# Patient Record
Sex: Male | Born: 1970 | Race: White | Hispanic: No | Marital: Married
Health system: Southern US, Community
[De-identification: ages and names within clinical notes are randomized; demographics above are authoritative.]

## PROBLEM LIST (undated history)

## (undated) DIAGNOSIS — G4733 Obstructive sleep apnea (adult) (pediatric): Secondary | ICD-10-CM

## (undated) DIAGNOSIS — F32A Depression, unspecified: Secondary | ICD-10-CM

## (undated) DIAGNOSIS — I10 Essential (primary) hypertension: Secondary | ICD-10-CM

## (undated) DIAGNOSIS — E785 Hyperlipidemia, unspecified: Secondary | ICD-10-CM

---

## 2020-06-23 ENCOUNTER — Encounter (HOSPITAL_COMMUNITY): Payer: Self-pay | Admitting: Emergency Medicine

## 2020-06-23 ENCOUNTER — Other Ambulatory Visit: Payer: Self-pay

## 2020-06-23 ENCOUNTER — Emergency Department (HOSPITAL_COMMUNITY)
Admission: EM | Admit: 2020-06-23 | Discharge: 2020-06-23 | Disposition: A | Discharge status: Home / Self Care | Payer: BC Managed Care – PPO | Attending: Emergency Medicine | Admitting: Emergency Medicine

## 2020-06-23 ENCOUNTER — Emergency Department (HOSPITAL_COMMUNITY): Payer: BC Managed Care – PPO

## 2020-06-23 DIAGNOSIS — R1013 Epigastric pain: Secondary | ICD-10-CM | POA: Insufficient documentation

## 2020-06-23 DIAGNOSIS — R42 Dizziness and giddiness: Secondary | ICD-10-CM | POA: Insufficient documentation

## 2020-06-23 DIAGNOSIS — I10 Essential (primary) hypertension: Secondary | ICD-10-CM | POA: Diagnosis not present

## 2020-06-23 DIAGNOSIS — R110 Nausea: Secondary | ICD-10-CM | POA: Insufficient documentation

## 2020-06-23 DIAGNOSIS — R1010 Upper abdominal pain, unspecified: Secondary | ICD-10-CM | POA: Diagnosis present

## 2020-06-23 HISTORY — DX: Obstructive sleep apnea (adult) (pediatric): G47.33

## 2020-06-23 HISTORY — DX: Depression, unspecified: F32.A

## 2020-06-23 HISTORY — DX: Hyperlipidemia, unspecified: E78.5

## 2020-06-23 HISTORY — DX: Essential (primary) hypertension: I10

## 2020-06-23 LAB — COMPREHENSIVE METABOLIC PANEL
ALT: 34 U/L (ref 0–44)
AST: 25 U/L (ref 15–41)
Albumin: 3.8 g/dL (ref 3.5–5.0)
Alkaline Phosphatase: 74 U/L (ref 38–126)
Anion gap: 13 (ref 5–15)
BUN: 11 mg/dL (ref 6–20)
CO2: 19 mmol/L — ABNORMAL LOW (ref 22–32)
Calcium: 9.2 mg/dL (ref 8.9–10.3)
Chloride: 106 mmol/L (ref 98–111)
Creatinine, Ser: 0.9 mg/dL (ref 0.61–1.24)
GFR, Estimated: >60 mL/min (ref >60)
Glucose, Bld: 119 mg/dL — ABNORMAL HIGH (ref 70–99)
Potassium: 3.7 mmol/L (ref 3.5–5.1)
Sodium: 138 mmol/L (ref 135–145)
Total Bilirubin: 1 mg/dL (ref 0.3–1.2)
Total Protein: 6.6 g/dL (ref 6.5–8.1)

## 2020-06-23 LAB — TROPONIN I (HIGH SENSITIVITY)
Troponin I (High Sensitivity): 6 ng/L (ref <18)
Troponin I (High Sensitivity): 5 ng/L (ref <18)

## 2020-06-23 LAB — CBC WITH DIFFERENTIAL/PLATELET
Abs Immature Granulocytes: 0.06 10*3/uL (ref 0.00–0.07)
Basophils Absolute: 0.1 10*3/uL (ref 0.0–0.1)
Basophils Relative: 1 %
Eosinophils Absolute: 0.2 10*3/uL (ref 0.0–0.5)
Eosinophils Relative: 1 %
HCT: 49.2 % (ref 39.0–52.0)
Hemoglobin: 16.2 g/dL (ref 13.0–17.0)
Immature Granulocytes: 1 %
Lymphocytes Relative: 15 %
Lymphs Abs: 1.9 10*3/uL (ref 0.7–4.0)
MCH: 27.6 pg (ref 26.0–34.0)
MCHC: 32.9 g/dL (ref 30.0–36.0)
MCV: 84 fL (ref 80.0–100.0)
Monocytes Absolute: 1 10*3/uL (ref 0.1–1.0)
Monocytes Relative: 8 %
Neutro Abs: 9.4 10*3/uL — ABNORMAL HIGH (ref 1.7–7.7)
Neutrophils Relative %: 74 %
Platelets: 353 10*3/uL (ref 150–400)
RBC: 5.86 MIL/uL — ABNORMAL HIGH (ref 4.22–5.81)
RDW: 14.6 % (ref 11.5–15.5)
WBC: 12.5 10*3/uL — ABNORMAL HIGH (ref 4.0–10.5)
nRBC: 0 % (ref 0.0–0.2)

## 2020-06-23 MED ORDER — ONDANSETRON 4 MG PO TBDP: 4.0000 mg | ORAL_TABLET | Freq: Three times a day (TID) | ORAL | 0 refills | Status: AC | PRN

## 2020-06-23 MED ORDER — METOCLOPRAMIDE HCL 5 MG/ML IJ SOLN
10.0000 mg | Freq: Once | INTRAMUSCULAR | Status: AC
  Administered 2020-06-23: 10 mg via INTRAVENOUS
  Filled 2020-06-23: qty 2

## 2020-06-23 MED ORDER — DIPHENHYDRAMINE HCL 50 MG/ML IJ SOLN
25.0000 mg | Freq: Once | INTRAMUSCULAR | Status: AC
  Administered 2020-06-23: 25 mg via INTRAVENOUS
  Filled 2020-06-23: qty 1

## 2020-06-23 NOTE — Discharge Instructions (Signed)
Please read and follow all provided instructions.  Your diagnoses today include:  1. Dizziness   2. Nausea   3. Epigastric pain     Tests performed today include:  An EKG of your heart  A chest x-ray  Cardiac enzymes - a blood test for heart muscle damage  Blood counts and electrolytes  Liver function test - was normal  Vital signs. See below for your results today.   Medications prescribed:   Take any prescribed medications only as directed.  Follow-up instructions: Please follow-up with your primary care provider as soon as you can for further evaluation of your symptoms.   Return instructions:  SEEK IMMEDIATE MEDICAL ATTENTION IF:  You have severe chest pain, especially if the pain is crushing or pressure-like and spreads to the arms, back, neck, or jaw, or if you have sweating, nausea (feeling sick to your stomach), or shortness of breath. THIS IS AN EMERGENCY. Don't wait to see if the pain will go away. Get medical help at once. Call 911 or [phone] (operator). DO NOT drive yourself to the hospital.   Your chest pain gets worse and does not go away with rest.   You have an attack of chest pain lasting longer than usual, despite rest and treatment with the medications your caregiver has prescribed.   You wake from sleep with chest pain or shortness of breath.  You feel dizzy or faint.  You have chest pain not typical of your usual pain for which you originally saw your caregiver.   You have any other emergent concerns regarding your health.  Additional Information: Chest pain comes from many different causes. Your caregiver has diagnosed you as having chest pain that is not specific for one problem, but does not require admission.  You are at low risk for an acute heart condition or other serious illness.   Your vital signs today were: BP (!) 142/84   Pulse 77   Temp 98.4 F (36.9 C) (Oral)   Resp 17   Ht 5\' 8"  (1.727 m)   Wt (!) 163.3 kg   SpO2 95%   BMI 54.74  kg/m  If your blood pressure (BP) was elevated above 135/85 this visit, please have this repeated by your doctor within one month. --------------

## 2020-06-23 NOTE — ED Triage Notes (Signed)
Pt arrives via EMS with intermittent abd pain and dizziness. Endorses increased nausea today and weakness in both legs.

## 2020-06-23 NOTE — ED Provider Notes (Signed)
[facility] EMERGENCY DEPARTMENT Provider Note   CSN: [id number] Arrival date & time: 06/23/20  1100     History Chief Complaint  Patient presents with  . Abdominal Pain    Jeffrey Whitehead is a 49 y.o. male.  Patient presents to the emergency department for evaluation of transient dizziness, weakness, nausea, and abdominal pain.  He is from [facility], [place] and has been visiting for a conference.  He states that he woke at 9:15 AM.  Immediately upon waking he had nausea and dizziness and a mild pain in his upper abdomen.  Dizziness was described as spinning and feeling lightheaded.  He denies chest pain, shortness of breath.  He thought that he would feel better if he got a shower.  He took a quick shower and symptoms persisted prompting call to EMS.  Patient was transported to the hospital.  Patient states that he had a similar episode x2 last Friday while he was driving with his son.  These were overall short-lived and resolved.  Patient does have a history of hypertension, hyperlipidemia, sleep apnea.  He states that he had a catheterization done about 10 years ago which showed normal coronary arteries per his report. Patient denies signs of stroke including: facial droop, slurred speech, aphasia, weakness/numbness in extremities, imbalance/trouble walking. Patient denies risk factors for pulmonary embolism including: unilateral leg swelling, history of DVT/PE/other blood clots, use of exogenous hormones, recent immobilizations, recent surgery, recent travel (>4hr segment), malignancy, hemoptysis.          Past Medical History:  Diagnosis Date  . Depression   . Hyperlipemia   . Hypertension   . OSA (obstructive sleep apnea)     There are no problems to display for this patient.   The histories are not reviewed yet. Please review them in the "History" navigator section and refresh this SmartLink.     No family history on file.  Social History    Tobacco Use  . Smoking status: Not on file  Substance Use Topics  . Alcohol use: Not on file  . Drug use: Not on file    Home Medications Prior to Admission medications   Not on File    Allergies    Penicillins  Review of Systems   Review of Systems  Constitutional: Negative for diaphoresis and fever.  Eyes: Negative for redness.  Respiratory: Negative for cough and shortness of breath.   Cardiovascular: Negative for chest pain, palpitations and leg swelling.  Gastrointestinal: Positive for abdominal pain and nausea. Negative for vomiting.  Genitourinary: Negative for dysuria.  Musculoskeletal: Negative for back pain and neck pain.  Skin: Negative for rash.  Neurological: Positive for dizziness, weakness (Generalized) and headaches. Negative for seizures, syncope, facial asymmetry, speech difficulty, light-headedness and numbness.  Psychiatric/Behavioral: The patient is not nervous/anxious.     Physical Exam Updated Vital Signs BP 131/76 (BP Location: Right Arm)   Pulse 81   Temp 98.4 F (36.9 C) (Oral)   Resp 18   Ht 5\' 8"  (1.727 m)   Wt (!) 163.3 kg   SpO2 97%   BMI 54.74 kg/m   Physical Exam Vitals and nursing note reviewed.  Constitutional:      Appearance: He is well-developed. He is not diaphoretic.  HENT:     Head: Normocephalic and atraumatic.     Mouth/Throat:     Mouth: Mucous membranes are not dry.  Eyes:     Conjunctiva/sclera: Conjunctivae normal.  Neck:  Vascular: Normal carotid pulses. No carotid bruit or JVD.     Trachea: Trachea normal. No tracheal deviation.  Cardiovascular:     Rate and Rhythm: Normal rate and regular rhythm.     Pulses: No decreased pulses.     Heart sounds: Normal heart sounds, S1 normal and S2 normal. Heart sounds not distant. No murmur heard.   Pulmonary:     Effort: Pulmonary effort is normal. No respiratory distress.     Breath sounds: Normal breath sounds. No wheezing.  Chest:     Chest wall: No  tenderness.  Abdominal:     General: Bowel sounds are normal.     Palpations: Abdomen is soft.     Tenderness: There is no abdominal tenderness. There is no guarding or rebound.  Genitourinary:    Testes:        Right: Tenderness or swelling not present.        Left: Tenderness or swelling not present.  Musculoskeletal:     Cervical back: Normal range of motion and neck supple. No muscular tenderness.  Skin:    General: Skin is warm and dry.     Coloration: Skin is not pale.  Neurological:     Mental Status: He is alert.     ED Results / Procedures / Treatments   Labs (all labs ordered are listed, but only abnormal results are displayed) Labs Reviewed  CBC WITH DIFFERENTIAL/PLATELET - Abnormal; Notable for the following components:      Result Value   WBC 12.5 (*)    RBC 5.86 (*)    Neutro Abs 9.4 (*)    All other components within normal limits  COMPREHENSIVE METABOLIC PANEL - Abnormal; Notable for the following components:   CO2 19 (*)    Glucose, Bld 119 (*)    All other components within normal limits  TROPONIN I (HIGH SENSITIVITY)  TROPONIN I (HIGH SENSITIVITY)    ED ECG REPORT   Date: 06/23/2020  Rate: 78  Rhythm: normal sinus rhythm  QRS Axis: normal  Intervals: normal  ST/T Wave abnormalities: nonspecific T wave changes  Conduction Disutrbances:none  Narrative Interpretation:   Old EKG Reviewed: none available  I have personally reviewed the EKG tracing and agree with the computerized printout as noted.  Radiology DG Chest 2 View  Result Date: 06/23/2020 CLINICAL DATA:  Dizziness, nausea, abdominal pain EXAM: CHEST - 2 VIEW COMPARISON:  None. FINDINGS: The heart size and mediastinal contours are within normal limits. Both lungs are clear. The visualized skeletal structures are unremarkable. IMPRESSION: No active cardiopulmonary disease. Electronically Signed   By: Duanne Guess D.O.   On: 06/23/2020 11:58    Procedures Procedures (including  critical care time)  Medications Ordered in ED Medications  metoCLOPramide (REGLAN) injection 10 mg (10 mg Intravenous Given 06/23/20 1424)  diphenhydrAMINE (BENADRYL) injection 25 mg (25 mg Intravenous Given 06/23/20 1424)    ED Course  I have reviewed the triage vital signs and the nursing notes.  Pertinent labs & imaging results that were available during my care of the patient were reviewed by me and considered in my medical decision making (see chart for details).  Patient seen and examined. Work-up initiated.  Will ensure no signs of cardiac ischemia today.  EKG reviewed.  Will check chest x-ray and cardiac labs.  Patient currently asymptomatic and appears well.  Vital signs are reassuring.  Vital signs reviewed and are as follows: BP 131/76 (BP Location: Right Arm)   Pulse 81  Temp 98.4 F (36.9 C) (Oral)   Resp 18   Ht 5\' 8"  (1.727 m)   Wt (!) 163.3 kg   SpO2 97%   BMI 54.74 kg/m   1:29 PM patient rechecked, updated on results to this point.  We discussed nonspecificity of white blood cell count.  Patient currently complaining of ongoing nausea as well as development of a headache is arrival.  Patient is not driving home tonight as his wife is traveling up to pick him up.  I have ordered Reglan and Benadryl to treat headache and nausea.  Will reassess.  Currently awaiting second troponin.  4:13 PM 2nd troponin negative.  Patient updated on results.  Patient states that headache is still present but much improved from arrival, as well as nausea.  Wife now bedside.  We discussed laboratory findings.  Encourage PCP follow-up when he returns home.  Will give prescription of Zofran to use if nausea returns.  Patient was counseled to return with severe chest pain, especially if the pain is crushing or pressure-like and spreads to the arms, back, neck, or jaw, or if they have sweating, nausea, or shortness of breath with the pain. They were encouraged to call 911 with these symptoms.    Patient counseled to return if they have weakness in their arms or legs, slurred speech, trouble walking or talking, confusion, trouble with their balance, or if they have any other concerns. Patient verbalizes understanding and agrees with plan.      MDM Rules/Calculators/A&P                          Patient with dizziness and nausea today with associated vertigo.  Symptoms now improved.  Cardiac work-up is negative with nonischemic EKG, troponin negative x2, negative chest x-ray.  Patient did not have significant chest pain and I have low concern for PE.  No hypoxia or tachycardia.  Also low concern for dissection.  CMP is negative, abdomen is soft and nontender.  Patient had a headache that began after arrival.  No focal neurologic deficits.  Improved with Reglan and Benadryl.  Doubt hemorrhagic or ischemic stroke.  Strict return instructions discussed as above.    Final Clinical Impression(s) / ED Diagnoses Final diagnoses:  Dizziness  Nausea  Epigastric pain    Rx / DC Orders ED Discharge Orders         Ordered    ondansetron (ZOFRAN ODT) 4 MG disintegrating tablet  Every 8 hours PRN        06/23/20 1604           13/10/21, PA-C 06/23/20 1617    13/10/21, MD 06/23/20 1644

## 2020-06-23 NOTE — ED Notes (Signed)
Pt discharged via wheelchair with family. All questions and concerns addressed. No complaints at this time.   

## 2021-05-25 IMAGING — DX DG CHEST 2V
2 series · 2 of 2 positions shown · non-contrast
Comparison: None.

CLINICAL DATA: Dizziness, nausea, abdominal pain

EXAM:
CHEST - 2 VIEW

[chest pa]
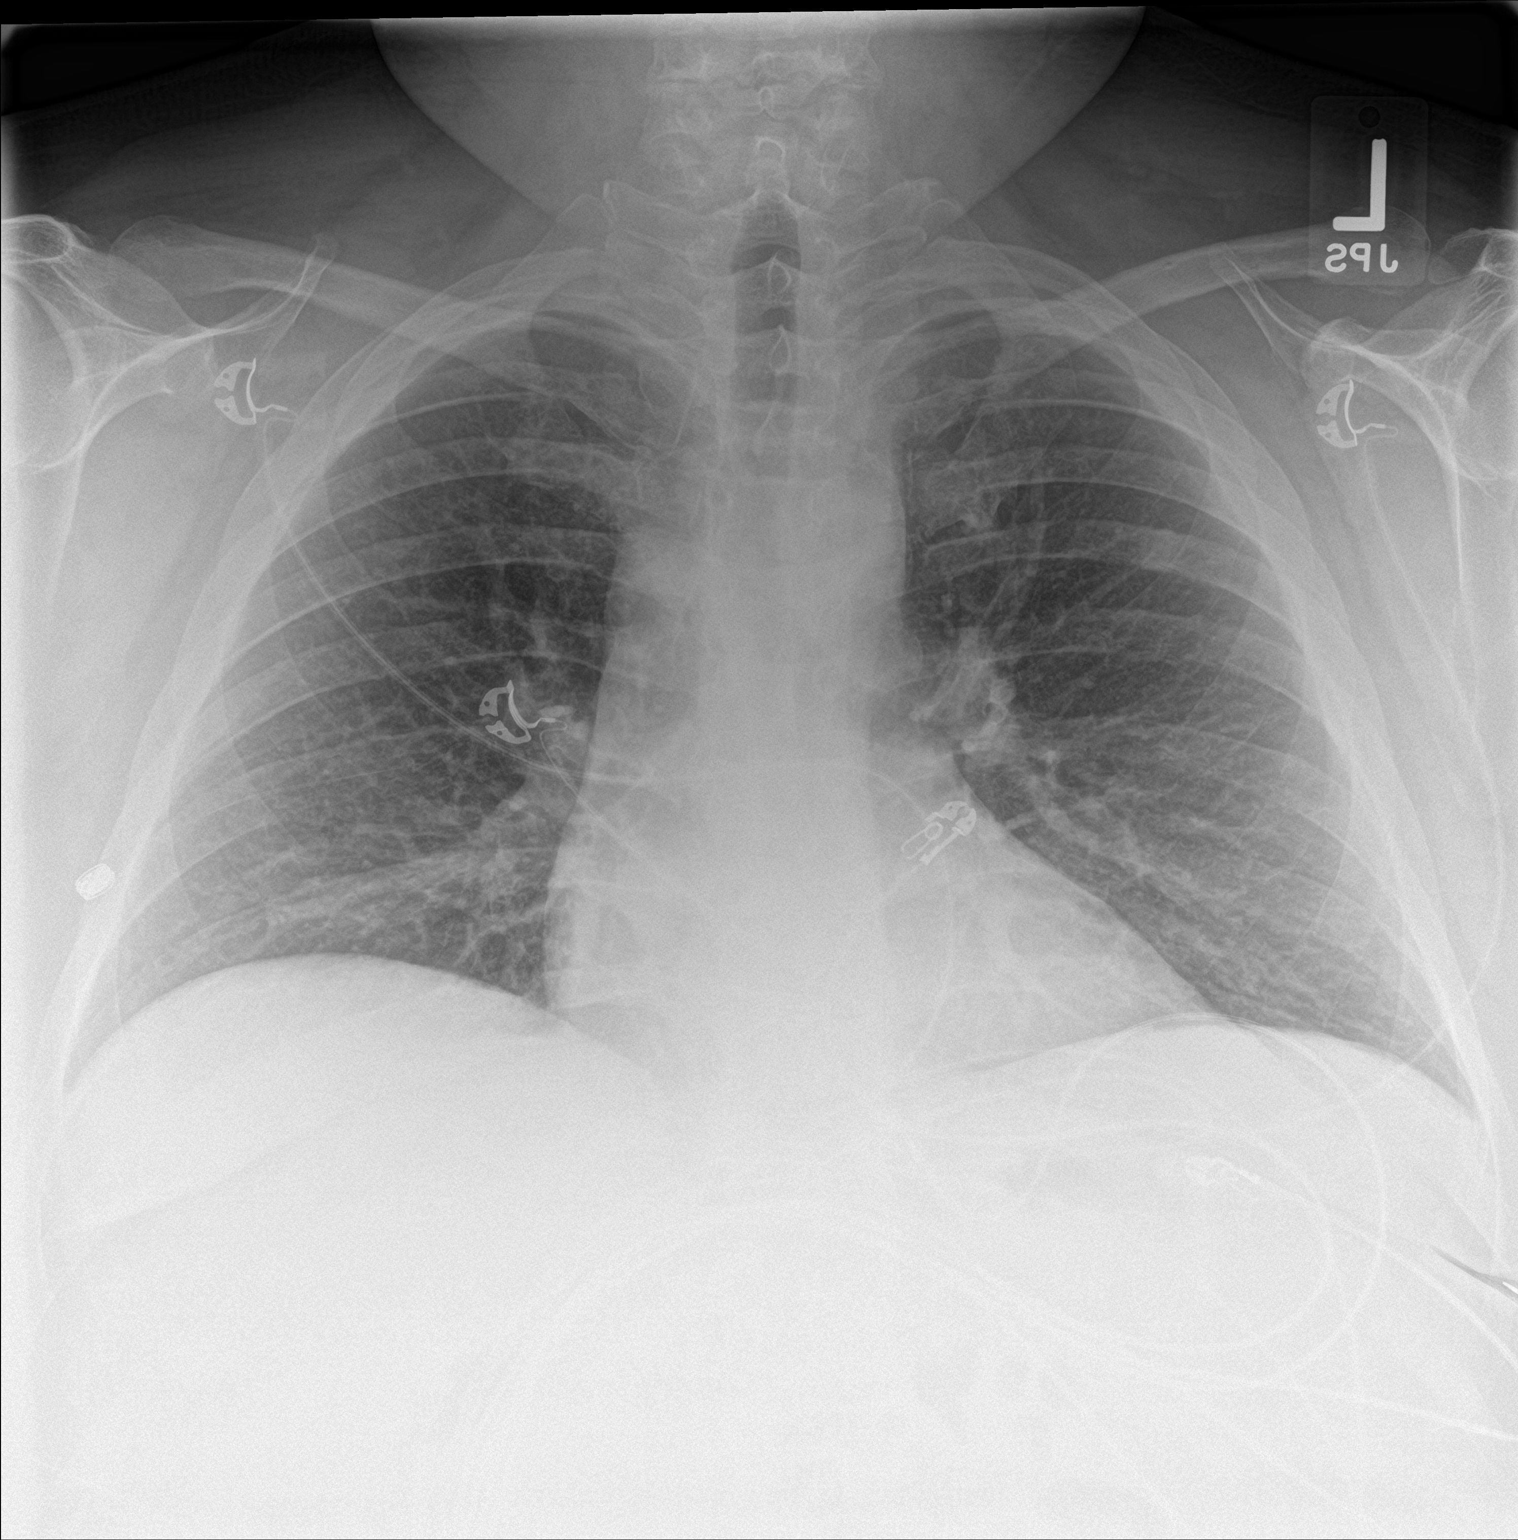

[chest lat]
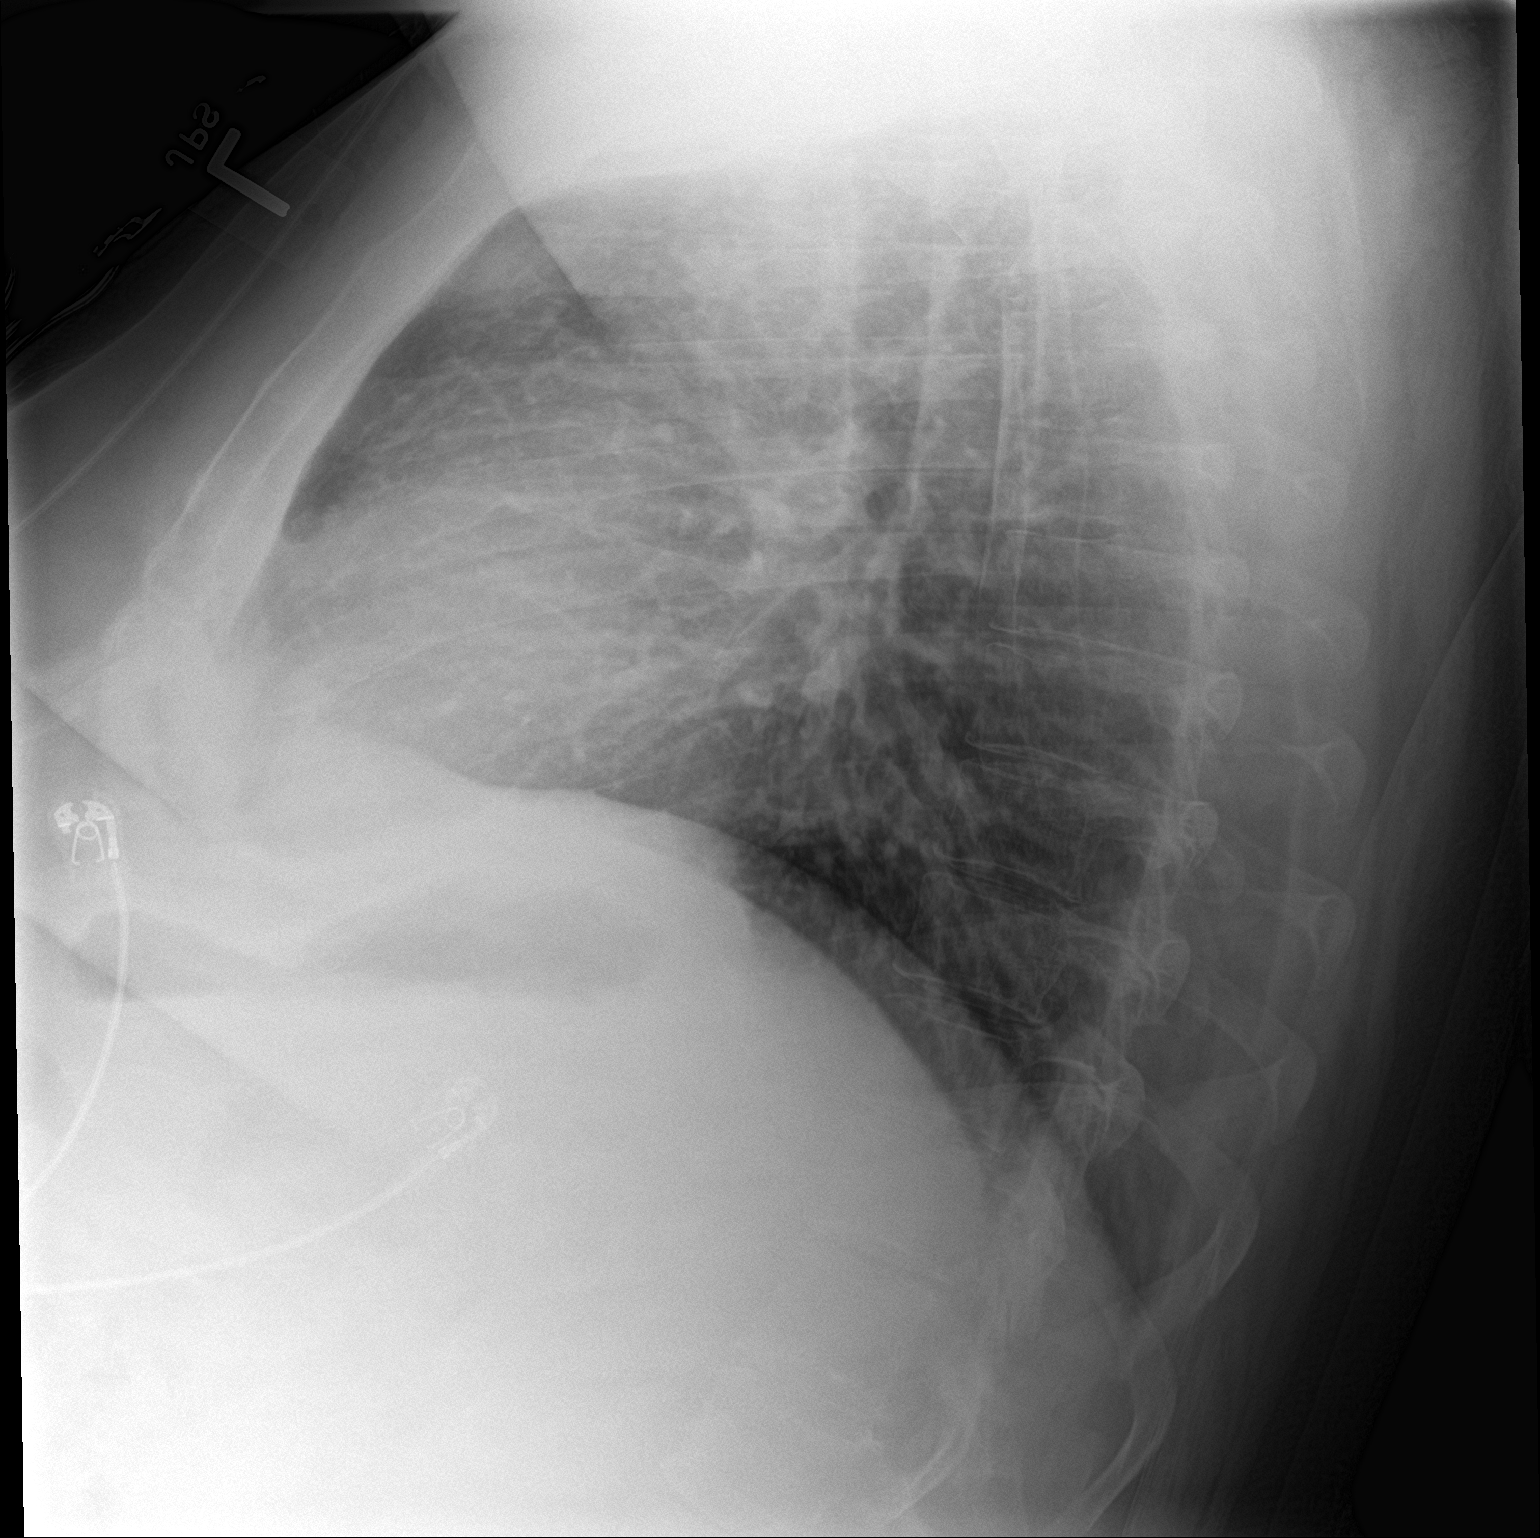

[2 of 2 positions shown; findings below may reference images not displayed]

FINDINGS: The heart size and mediastinal contours are within normal limits.
Both lungs are clear. The visualized skeletal structures are
unremarkable.
IMPRESSION: No active cardiopulmonary disease.
